# Patient Record
Sex: Female | Born: 2005 | Race: White | Hispanic: No | Marital: Single | Smoking: Never smoker
Health system: Southern US, Community
[De-identification: ages and names within clinical notes are randomized; demographics above are authoritative.]

## PROBLEM LIST (undated history)

## (undated) HISTORY — PX: TEAR DUCT PROBING: SHX793

---
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
## 2023-11-05 ENCOUNTER — Ambulatory Visit: Admission: EM | Admit: 2023-11-05 | Discharge: 2023-11-05 | Disposition: A | Discharge status: Home / Self Care

## 2023-11-05 DIAGNOSIS — M940 Chondrocostal junction syndrome [Tietze]: Secondary | ICD-10-CM

## 2023-11-05 NOTE — ED Provider Notes (Signed)
 [name]    CSN: [id number] Arrival date & time: 11/05/23  1820      History   Chief Complaint No chief complaint on file.   HPI Pam Peterson is a 18 y.o. female.   Jatoria works at [employer].  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  While at work today she developed pain in her left lower ribs.  It hurts with a deep breath.  Hurts with certain movement.  No recent cough or infection.  No shortness of breath no trauma. She is on no prescription medication She does not smoke cigarettes No underlying asthma or lung disease    History reviewed. No pertinent past medical history.  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      Home Medications    Prior to Admission medications   Not on File    Family History Family History  Problem Relation Age of Onset   Healthy Mother    Healthy Father    Healthy Sister     Social History Social History   Tobacco Use   Smoking status: Never   Smokeless tobacco: Never  Vaping Use   Vaping status: Never Used  Substance Use Topics   Alcohol use: No   Drug use: No     Allergies   Sulfa antibiotics   Review of Systems Review of Systems  See HPI Physical Exam Triage Vital Signs ED Triage Vitals  Encounter Vitals Group     BP 11/05/23 1829 127/85     Systolic BP Percentile --      Diastolic BP Percentile --      Pulse Rate 11/05/23 1829 100     Resp 11/05/23 1829 17     Temp 11/05/23 1829 97.9 F (36.6 C)     Temp Source 11/05/23 1829 Oral     SpO2 11/05/23 1829 100 %     Weight --      Height --      Head Circumference --      Peak Flow --      Pain Score 11/05/23 1830 7     Pain Loc --      Pain Education --      Exclude from Growth Chart --     No data found.  Updated Vital Signs BP 127/85 (BP Location: Right Arm)   Pulse 100   Temp 97.9 F (36.6 C) (Oral)   Resp 17   LMP 10/28/2023 (Exact Date)   SpO2 100%      Physical Exam Constitutional:      General: She is not in acute distress.    Appearance: She is well-developed. She is not ill-appearing.  HENT:     Head: Normocephalic and atraumatic.  Eyes:     Conjunctiva/sclera: Conjunctivae normal.     Pupils: Pupils are equal, round, and reactive to light.  Cardiovascular:     Rate and Rhythm: Normal rate.  Pulmonary:     Effort: Pulmonary effort is normal. No respiratory distress.  Chest:    Abdominal:     General: There is no distension.     Palpations: Abdomen is soft.  Musculoskeletal:        General: Normal  range of motion.     Cervical back: Normal range of motion.  Skin:    General: Skin is warm and dry.  Neurological:     Mental Status: She is alert.      UC Treatments / Results  Labs (all labs ordered are listed, but only abnormal results are displayed) Labs Reviewed - No data to display  EKG   Radiology No results found.  Procedures Procedures (including critical care time)  Medications Ordered in UC Medications - No data to display  Initial Impression / Assessment and Plan / UC Course  I have reviewed the triage vital signs and the nursing notes.  Pertinent labs & imaging results that were available during my care of the patient were reviewed by me and considered in my medical decision making (see chart for details).     Final Clinical Impressions(s) / UC Diagnoses   Final diagnoses:  Costochondritis, acute     Discharge Instructions      Take Aleve 2 pills in the morning and 2 at night Try ice or heat to area Limit heavy activity while ribs are painful See your doctor if not better by next week   ED Prescriptions   None    PDMP not reviewed this encounter.   Eustace Moore, MD 11/05/23 [phone]

## 2023-11-05 NOTE — Discharge Instructions (Signed)
 Take Aleve 2 pills in the morning and 2 at night Try ice or heat to area Limit heavy activity while ribs are painful See your doctor if not better by next week

## 2023-11-05 NOTE — ED Triage Notes (Signed)
 Pt c/o LT sided rib pain that started today. Denies injury and recent illness. Pain worse with deep breaths.

## 2023-11-08 ENCOUNTER — Ambulatory Visit: Admission: EM | Admit: 2023-11-08 | Discharge: 2023-11-08 | Disposition: A | Discharge status: Home / Self Care

## 2023-11-08 DIAGNOSIS — R509 Fever, unspecified: Secondary | ICD-10-CM

## 2023-11-08 DIAGNOSIS — R059 Cough, unspecified: Secondary | ICD-10-CM

## 2023-11-08 DIAGNOSIS — Z20828 Contact with and (suspected) exposure to other viral communicable diseases: Secondary | ICD-10-CM | POA: Diagnosis not present

## 2023-11-08 MED ORDER — PROMETHAZINE-DM 6.25-15 MG/5ML PO SYRP: 5.0000 mL | ORAL_SOLUTION | Freq: Two times a day (BID) | ORAL | 0 refills | Status: AC | PRN

## 2023-11-08 MED ORDER — OSELTAMIVIR PHOSPHATE 75 MG PO CAPS: 75.0000 mg | ORAL_CAPSULE | Freq: Two times a day (BID) | ORAL | 0 refills | Status: AC

## 2023-11-08 MED ORDER — BENZONATATE 200 MG PO CAPS: 200.0000 mg | ORAL_CAPSULE | Freq: Three times a day (TID) | ORAL | 0 refills | Status: AC | PRN

## 2023-11-08 MED ORDER — ACETAMINOPHEN 500 MG PO TABS
1000.0000 mg | ORAL_TABLET | ORAL | Status: AC
  Administered 2023-11-08: 1000 mg via ORAL

## 2023-11-08 NOTE — ED Triage Notes (Signed)
 Pt presents to uc with co of nausea, fatigue, body weakness, cough and and sore throat since yesterday afternoon. Pt reports she has taken musinex and otc cold and flu medications. Pt reports her sister was flu a pos on Sunday.

## 2023-11-08 NOTE — Discharge Instructions (Addendum)
 Advised patient to take medications as directed with food to completion.  Advised may take Tessalon capsules daily as needed for cough.  Advised may use Promethazine DM cough syrup at night prior to sleep for cough due to sedative effects.  Advised may take OTC Tylenol 1 g every 6 hours for fever (oral temperature greater than 100.3).  Encouraged to increase daily water intake to 64 ounces per day while taking these medications.  Advised if symptoms worsen and/or unresolved please follow-up with your PCP or here for further evaluation.

## 2023-11-08 NOTE — ED Provider Notes (Signed)
 [name]    CSN: [id number] Arrival date & time: 11/08/23  1851      History   Chief Complaint Chief Complaint  Patient presents with   URI    HPI Pam Peterson is a 18 y.o. female.   HPI 18 year old female presents with sore throat, cough, headache and fatigue x 2 days.  Patient is accompanied by her Mother who is also exposed to influenza from her elder sister.  PMH significant for obesity.  History reviewed. No pertinent past medical history.  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      Home Medications    Prior to Admission medications   Medication Sig Start Date End Date Taking? Authorizing Provider  benzonatate (TESSALON) 200 MG capsule Take 1 capsule (200 mg total) by mouth 3 (three) times daily as needed for up to 7 days. 11/08/23 11/15/23 Yes Trevor Iha, FNP  JUNEL FE 1/20 1-20 MG-MCG tablet Take 1 tablet by mouth daily. 10/29/23  Yes [provider]  oseltamivir (TAMIFLU) 75 MG capsule Take 1 capsule (75 mg total) by mouth every 12 (twelve) hours. 11/08/23  Yes Trevor Iha, FNP  promethazine-dextromethorphan (PROMETHAZINE-DM) 6.25-15 MG/5ML syrup Take 5 mLs by mouth 2 (two) times daily as needed for cough. 11/08/23  Yes Trevor Iha, FNP    Family History Family History  Problem Relation Age of Onset   Healthy Mother    Healthy Father    Healthy Sister     Social History Social History   Tobacco Use   Smoking status: Never   Smokeless tobacco: Never  Vaping Use   Vaping status: Never Used  Substance Use Topics   Alcohol use: No   Drug use: No     Allergies   Sulfa antibiotics   Review of Systems Review of Systems  Constitutional:  Positive for fatigue.  HENT:  Positive for sore throat.   Respiratory:  Positive for cough.    Neurological:  Positive for headaches.  All other systems reviewed and are negative.    Physical Exam Triage Vital Signs ED Triage Vitals  Encounter Vitals Group     BP      Systolic BP Percentile      Diastolic BP Percentile      Pulse      Resp      Temp      Temp src      SpO2      Weight      Height      Head Circumference      Peak Flow      Pain Score      Pain Loc      Pain Education      Exclude from Growth Chart    No data found.  Updated Vital Signs BP 124/81   Pulse (!) 125   Temp 100.3 F (37.9 C)   Resp 16   LMP 10/28/2023 (Exact Date)   SpO2 98%   Physical Exam Vitals and nursing note reviewed.  Constitutional:      Appearance: Normal appearance. She is obese. She is ill-appearing.  HENT:     Head: Normocephalic and atraumatic.     Right Ear: Tympanic membrane, ear canal and external ear normal.  Left Ear: Tympanic membrane, ear canal and external ear normal.     Mouth/Throat:     Mouth: Mucous membranes are moist.     Pharynx: Oropharynx is clear.  Eyes:     Extraocular Movements: Extraocular movements intact.     Conjunctiva/sclera: Conjunctivae normal.     Pupils: Pupils are equal, round, and reactive to light.  Cardiovascular:     Rate and Rhythm: Normal rate and regular rhythm.     Pulses: Normal pulses.     Heart sounds: Normal heart sounds.  Pulmonary:     Effort: Pulmonary effort is normal.     Breath sounds: Normal breath sounds. No wheezing, rhonchi or rales.     Comments: Infrequent nonproductive cough on exam Musculoskeletal:        General: Normal range of motion.     Cervical back: Normal range of motion and neck supple.  Skin:    General: Skin is warm and dry.  Neurological:     General: No focal deficit present.     Mental Status: She is alert and oriented to person, place, and time. Mental status is at baseline.      UC Treatments / Results  Labs (all labs ordered are listed, but only abnormal results are  displayed) Labs Reviewed - No data to display  EKG   Radiology No results found.  Procedures Procedures (including critical care time)  Medications Ordered in UC Medications  acetaminophen (TYLENOL) tablet 1,000 mg (1,000 mg Oral Given 11/08/23 1917)    Initial Impression / Assessment and Plan / UC Course  I have reviewed the triage vital signs and the nursing notes.  Pertinent labs & imaging results that were available during my care of the patient were reviewed by me and considered in my medical decision making (see chart for details).     MDM: 1.  Exposure to influenza-Rx Tamiflu 75 mg capsule: Take 1 capsule twice daily x 5 days; 2.  Cough, unspecified type-Rx'd Tessalon 200 mg capsules: Take 1 capsule 3 times daily, as needed for cough, Rx'd Promethazine DM 6.25-15 Mg/5 mL syrup: Take 5 mL twice daily, as needed for cough.;  3.  Fever, unspecified-Tylenol 1 g given to patient prior to discharge, advised patient may take OTC Tylenol 1 g every 6 hours for fever (oral temperature greater than 100.3). Advised patient to take medications as directed with food to completion.  Advised may take Tessalon capsules daily as needed for cough.  Advised may use Promethazine DM cough syrup at night prior to sleep for cough due to sedative effects.  Advised may take OTC Tylenol 1 g every 6 hours for fever (oral temperature greater than 100.3).  Encouraged to increase daily water intake to 64 ounces per day while taking these medications.  Advised if symptoms worsen and/or unresolved please follow-up with your PCP or here for further evaluation.  Patient discharged home, hemodynamically stable. Final Clinical Impressions(s) / UC Diagnoses   Final diagnoses:  Exposure to influenza  Cough, unspecified type  Fever, unspecified     Discharge Instructions      Advised patient to take medications as directed with food to completion.  Advised may take Tessalon capsules daily as needed for cough.   Advised may use Promethazine DM cough syrup at night prior to sleep for cough due to sedative effects.  Advised may take OTC Tylenol 1 g every 6 hours for fever (oral temperature greater than 100.3).  Encouraged to increase daily water intake to  64 ounces per day while taking these medications.  Advised if symptoms worsen and/or unresolved please follow-up with your PCP or here for further evaluation.     ED Prescriptions     Medication Sig Dispense Auth. Provider   oseltamivir (TAMIFLU) 75 MG capsule Take 1 capsule (75 mg total) by mouth every 12 (twelve) hours. 10 capsule Trevor Iha, FNP   benzonatate (TESSALON) 200 MG capsule Take 1 capsule (200 mg total) by mouth 3 (three) times daily as needed for up to 7 days. 40 capsule Trevor Iha, FNP   promethazine-dextromethorphan (PROMETHAZINE-DM) 6.25-15 MG/5ML syrup Take 5 mLs by mouth 2 (two) times daily as needed for cough. 118 mL Trevor Iha, FNP      PDMP not reviewed this encounter.   Trevor Iha, FNP 11/08/23 1926

## 2024-03-03 ENCOUNTER — Ambulatory Visit
Admission: EM | Admit: 2024-03-03 | Discharge: 2024-03-03 | Disposition: A | Discharge status: Home / Self Care | Attending: Family Medicine | Admitting: Family Medicine

## 2024-03-03 ENCOUNTER — Encounter: Payer: Self-pay | Admitting: Emergency Medicine

## 2024-03-03 DIAGNOSIS — H6501 Acute serous otitis media, right ear: Secondary | ICD-10-CM | POA: Diagnosis not present

## 2024-03-03 MED ORDER — AMOXICILLIN-POT CLAVULANATE 875-125 MG PO TABS: 1.0000 | ORAL_TABLET | Freq: Two times a day (BID) | ORAL | 0 refills | Status: AC

## 2024-03-03 NOTE — ED Triage Notes (Signed)
 Patient c/o right ear ache and swollen glands on the right side of her neck she noticed today.  Patient has taken Tylenol  @ 12pm.

## 2024-03-03 NOTE — ED Provider Notes (Signed)
 [facility]    CSN: [id number] Arrival date & time: 03/03/24  1806      History   Chief Complaint Chief Complaint  Patient presents with   Otalgia    HPI Pam Peterson is a 18 y.o. female.   HPI 18 year old female presents with right earache and swollen glands on the right side of her neck that she noticed today.  Patient is accompanied by her mother and reports taking Tylenol  at noon today.  History reviewed. No pertinent past medical history.  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      Home Medications    Prior to Admission medications   Medication Sig Start Date End Date Taking? Authorizing Provider  amoxicillin -clavulanate (AUGMENTIN ) 875-125 MG tablet Take 1 tablet by mouth every 12 (twelve) hours. 03/03/24  Yes Teddy Sharper, FNP  JUNEL FE 1/20 1-20 MG-MCG tablet Take 1 tablet by mouth daily. 10/29/23  Yes [provider]  oseltamivir  (TAMIFLU ) 75 MG capsule Take 1 capsule (75 mg total) by mouth every 12 (twelve) hours. 11/08/23   Christoffer Currier, FNP  promethazine -dextromethorphan (PROMETHAZINE -DM) 6.25-15 MG/5ML syrup Take 5 mLs by mouth 2 (two) times daily as needed for cough. 11/08/23   Teddy Sharper, FNP    Family History Family History  Problem Relation Age of Onset   Healthy Mother    Healthy Father    Healthy Sister     Social History Social History   Tobacco Use   Smoking status: Never   Smokeless tobacco: Never  Vaping Use   Vaping status: Never Used  Substance Use Topics   Alcohol use: No   Drug use: No     Allergies   Sulfa antibiotics   Review of Systems Review of Systems   Physical Exam Triage Vital Signs ED Triage Vitals  Encounter Vitals Group     BP 03/03/24 1822 116/75     Girls Systolic BP Percentile --      Girls  Diastolic BP Percentile --      Boys Systolic BP Percentile --      Boys Diastolic BP Percentile --      Pulse Rate 03/03/24 1822 98     Resp 03/03/24 1822 18     Temp 03/03/24 1822 99.9 F (37.7 C)     Temp Source 03/03/24 1822 Oral     SpO2 03/03/24 1822 98 %     Weight 03/03/24 1824 180 lb (81.6 kg)     Height 03/03/24 1824 5' 7 (1.702 m)     Head Circumference --      Peak Flow --      Pain Score 03/03/24 1824 7     Pain Loc --      Pain Education --      Exclude from Growth Chart --    No data found.  Updated Vital Signs BP 116/75 (BP Location: Right Arm)   Pulse 98   Temp 99.9 F (37.7 C) (Oral)   Resp 18   Ht 5' 7 (1.702 m)   Wt 180 lb (81.6 kg)   LMP 02/18/2024   SpO2 98%   BMI 28.19 kg/m   Visual Acuity Right Eye Distance:   Left Eye Distance:   Bilateral Distance:    Right Eye  Near:   Left Eye Near:    Bilateral Near:     Physical Exam Vitals and nursing note reviewed.  Constitutional:      Appearance: Normal appearance. She is normal weight.  HENT:     Head: Normocephalic and atraumatic.     Right Ear: Ear canal and external ear normal.     Left Ear: Tympanic membrane, ear canal and external ear normal.     Ears:     Comments: Right TM: Mildly red rimmed, retracted with scant serous effusion noted    Mouth/Throat:     Mouth: Mucous membranes are moist.     Pharynx: Oropharynx is clear.  Eyes:     Extraocular Movements: Extraocular movements intact.     Conjunctiva/sclera: Conjunctivae normal.     Pupils: Pupils are equal, round, and reactive to light.  Cardiovascular:     Rate and Rhythm: Normal rate and regular rhythm.     Pulses: Normal pulses.     Heart sounds: Normal heart sounds.  Pulmonary:     Effort: Pulmonary effort is normal.     Breath sounds: Normal breath sounds. No wheezing, rhonchi or rales.  Musculoskeletal:        General: Normal range of motion.  Skin:    General: Skin is warm and dry.  Neurological:     General: No  focal deficit present.     Mental Status: She is alert and oriented to person, place, and time. Mental status is at baseline.  Psychiatric:        Mood and Affect: Mood normal.        Behavior: Behavior normal.      UC Treatments / Results  Labs (all labs ordered are listed, but only abnormal results are displayed) Labs Reviewed - No data to display  EKG   Radiology No results found.  Procedures Procedures (including critical care time)  Medications Ordered in UC Medications - No data to display  Initial Impression / Assessment and Plan / UC Course  I have reviewed the triage vital signs and the nursing notes.  Pertinent labs & imaging results that were available during my care of the patient were reviewed by me and considered in my medical decision making (see chart for details).     MDM: 1.  Right acute serous otitis media, recurrence not specified-Rx'd Augmentin  875/125 mg tablet: Take 1 tablet twice daily x 7 days.  Advised mother and patient very mild right ear infection patient is going [place] beach on [place]24 for vacation. Advised patient to take medication as directed with food to completion.  Encouraged to increase daily water intake to 64 ounces per day while taking these medications.  Advised if symptoms worsen and/or unresolved please follow-up with your PCP or here for further evaluation. Final Clinical Impressions(s) / UC Diagnoses   Final diagnoses:  Right acute serous otitis media, recurrence not specified     Discharge Instructions      Advised patient to take medication as directed with food to completion.  Encouraged to increase daily water intake to 64 ounces per day while taking these medications.  Advised if symptoms worsen and/or unresolved please follow-up with your PCP or here for further evaluation.     ED Prescriptions     Medication Sig Dispense Auth. Provider   amoxicillin -clavulanate (AUGMENTIN ) 875-125 MG tablet Take 1 tablet by mouth  every 12 (twelve) hours. 14 tablet Christinia Lambeth, FNP      PDMP not reviewed this encounter.  Teddy Sharper, FNP 03/03/24 AMOS

## 2024-03-03 NOTE — Discharge Instructions (Addendum)
 Advised patient to take medication as directed with food to completion.  Encouraged to increase daily water intake to 64 ounces per day while taking these medications.  Advised if symptoms worsen and/or unresolved please follow-up with your PCP or here for further evaluation.

## 2024-07-17 ENCOUNTER — Ambulatory Visit
Admission: EM | Admit: 2024-07-17 | Discharge: 2024-07-17 | Disposition: A | Discharge status: Home / Self Care | Attending: Internal Medicine | Admitting: Internal Medicine

## 2024-07-17 ENCOUNTER — Other Ambulatory Visit: Payer: Self-pay

## 2024-07-17 DIAGNOSIS — R0981 Nasal congestion: Secondary | ICD-10-CM

## 2024-07-17 LAB — POCT INFLUENZA A/B
Influenza A, POC: NEGATIVE
Influenza B, POC: NEGATIVE

## 2024-07-17 LAB — POC SOFIA SARS ANTIGEN FIA: SARS Coronavirus 2 Ag: NEGATIVE

## 2024-07-17 MED ORDER — FLUTICASONE PROPIONATE 50 MCG/ACT NA SUSP: 1.0000 | Freq: Every day | NASAL | 0 refills | Status: AC

## 2024-07-17 MED ORDER — CETIRIZINE HCL 10 MG PO TABS: 10.0000 mg | ORAL_TABLET | Freq: Every day | ORAL | 0 refills | Status: AC

## 2024-07-17 NOTE — ED Triage Notes (Signed)
 Has c/o nasal congestion, runny nose, head pressure, bil ear fullness which started Tuesday morning. No fever. Has been taking sinus medication.

## 2024-07-17 NOTE — ED Provider Notes (Signed)
 [facility]    CSN: [id number] Arrival date & time: 07/17/24  1553      History   Chief Complaint Chief Complaint  Patient presents with   Nasal Congestion   Ear Fullness    HPI Pam Peterson is a 18 y.o. female.   Patient presents with 3-day history of nasal congestion, runny nose, headache, bilateral ear fullness.  Denies cough or fever.  Reports known sick contact.  Has been taking over-the-counter sinus medication.   Ear Fullness    History reviewed. No pertinent past medical history.  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      Home Medications    Prior to Admission medications   Medication Sig Start Date End Date Taking? Authorizing Provider  cetirizine (ZYRTEC) 10 MG tablet Take 1 tablet (10 mg total) by mouth daily. 07/17/24  Yes [place] Every, Darryle E, FNP  fluticasone (FLONASE) 50 MCG/ACT nasal spray Place 1 spray into both nostrils daily. 07/17/24  Yes Jahaan Vanwagner, Darryle E, FNP  amoxicillin -clavulanate (AUGMENTIN ) 875-125 MG tablet Take 1 tablet by mouth every 12 (twelve) hours. 03/03/24   Teddy Sharper, FNP  JUNEL FE 1/20 1-20 MG-MCG tablet Take 1 tablet by mouth daily. 10/29/23   [provider]  oseltamivir  (TAMIFLU ) 75 MG capsule Take 1 capsule (75 mg total) by mouth every 12 (twelve) hours. 11/08/23   Ragan, Michael, FNP  promethazine -dextromethorphan (PROMETHAZINE -DM) 6.25-15 MG/5ML syrup Take 5 mLs by mouth 2 (two) times daily as needed for cough. 11/08/23   Teddy Sharper, FNP    Family History Family History  Problem Relation Age of Onset   Healthy Mother    Healthy Father    Healthy Sister     Social History Social History   Tobacco Use   Smoking status: Never   Smokeless tobacco: Never  Vaping Use   Vaping status: Never Used  Substance Use  Topics   Alcohol use: No   Drug use: No     Allergies   Sulfa antibiotics   Review of Systems Review of Systems Per HPI  Physical Exam Triage Vital Signs ED Triage Vitals  Encounter Vitals Group     BP 07/17/24 1605 133/84     Girls Systolic BP Percentile --      Girls Diastolic BP Percentile --      Boys Systolic BP Percentile --      Boys Diastolic BP Percentile --      Pulse Rate 07/17/24 1605 88     Resp 07/17/24 1605 16     Temp 07/17/24 1605 98.8 F (37.1 C)     Temp src --      SpO2 07/17/24 1605 100 %     Weight --      Height --      Head Circumference --      Peak Flow --      Pain Score 07/17/24 1610 7     Pain Loc --      Pain Education --      Exclude from Growth Chart --    No data found.  Updated Vital Signs BP 133/84   Pulse 88   Temp 98.8 F (37.1 C)   Resp 16   LMP 07/08/2024 (Approximate)   SpO2 100%  Visual Acuity Right Eye Distance:   Left Eye Distance:   Bilateral Distance:    Right Eye Near:   Left Eye Near:    Bilateral Near:     Physical Exam Constitutional:      General: She is not in acute distress.    Appearance: Normal appearance. She is not toxic-appearing or diaphoretic.  HENT:     Head: Normocephalic and atraumatic.     Right Ear: Ear canal normal. A middle ear effusion is present. Tympanic membrane is not perforated, erythematous or bulging.     Left Ear: Ear canal normal. A middle ear effusion is present. Tympanic membrane is not perforated, erythematous or bulging.     Nose: Congestion present.     Mouth/Throat:     Mouth: Mucous membranes are moist.     Pharynx: No posterior oropharyngeal erythema.  Eyes:     Extraocular Movements: Extraocular movements intact.     Conjunctiva/sclera: Conjunctivae normal.     Pupils: Pupils are equal, round, and reactive to light.  Cardiovascular:     Rate and Rhythm: Normal rate and regular rhythm.     Pulses: Normal pulses.     Heart sounds: Normal heart sounds.   Pulmonary:     Effort: Pulmonary effort is normal. No respiratory distress.     Breath sounds: Normal breath sounds. No stridor. No wheezing, rhonchi or rales.  Musculoskeletal:        General: Normal range of motion.     Cervical back: Normal range of motion.  Skin:    General: Skin is warm and dry.  Neurological:     General: No focal deficit present.     Mental Status: She is alert and oriented to person, place, and time. Mental status is at baseline.  Psychiatric:        Mood and Affect: Mood normal.        Behavior: Behavior normal.      UC Treatments / Results  Labs (all labs ordered are listed, but only abnormal results are displayed) Labs Reviewed  POCT INFLUENZA A/B  POC SOFIA SARS ANTIGEN FIA    EKG   Radiology No results found.  Procedures Procedures (including critical care time)  Medications Ordered in UC Medications - No data to display  Initial Impression / Assessment and Plan / UC Course  I have reviewed the triage vital signs and the nursing notes.  Pertinent labs & imaging results that were available during my care of the patient were reviewed by me and considered in my medical decision making (see chart for details).     Differential diagnoses include allergic rhinitis versus viral illness.  COVID and flu test negative.  Will treat with cetirizine antihistamine and Flonase.  Patient denies that she takes any of these daily.  Advised supportive care, fluids, rest.  Advised strict follow-up precautions.  Patient verbalized understanding and was agreeable with plan. Final Clinical Impressions(s) / UC Diagnoses   Final diagnoses:  Nasal congestion     Discharge Instructions      COVID and flu tests were negative.  I have prescribed you 2 medications to help with symptoms.  Please follow-up if any symptoms persist or worsen.     ED Prescriptions     Medication Sig Dispense Auth. Provider   cetirizine (ZYRTEC) 10 MG tablet Take 1 tablet  (10 mg total) by mouth daily. 30 tablet Junius Faucett E, FNP   fluticasone [facility]) 50 MCG/ACT nasal spray Place 1 spray into  both nostrils daily. 16 g Hazen Darryle BRAVO, [place]      PDMP not reviewed this encounter.   Hazen Darryle BRAVO, [place] 07/17/24 [id number]

## 2024-07-17 NOTE — Discharge Instructions (Signed)
 COVID and flu tests were negative.  I have prescribed you 2 medications to help with symptoms.  Please follow-up if any symptoms persist or worsen.
